# Patient Record
Sex: Female | Born: 1955 | Race: White | Hispanic: No | Smoking: Never smoker
Health system: Southern US, Community
[De-identification: ages and names within clinical notes are randomized; demographics above are authoritative.]

## PROBLEM LIST (undated history)

## (undated) DIAGNOSIS — E079 Disorder of thyroid, unspecified: Secondary | ICD-10-CM

## (undated) DIAGNOSIS — I10 Essential (primary) hypertension: Secondary | ICD-10-CM

---
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
## 2014-07-29 ENCOUNTER — Encounter: Payer: Self-pay | Admitting: Sports Medicine

## 2014-07-29 ENCOUNTER — Ambulatory Visit (INDEPENDENT_AMBULATORY_CARE_PROVIDER_SITE_OTHER): Payer: BC Managed Care – PPO | Admitting: Sports Medicine

## 2014-07-29 ENCOUNTER — Ambulatory Visit (INDEPENDENT_AMBULATORY_CARE_PROVIDER_SITE_OTHER): Payer: BC Managed Care – PPO

## 2014-07-29 VITALS — BP 138/81 | HR 74 | Ht 64.0 in | Wt 187.0 lb

## 2014-07-29 DIAGNOSIS — M722 Plantar fascial fibromatosis: Secondary | ICD-10-CM

## 2014-07-29 DIAGNOSIS — M7731 Calcaneal spur, right foot: Secondary | ICD-10-CM

## 2014-07-29 MED ORDER — MELOXICAM 15 MG PO TABS
ORAL_TABLET | ORAL | Status: AC
Start: 2014-07-29 — End: ?

## 2014-07-29 NOTE — Progress Notes (Signed)
   Subjective:    I'm seeing this patient as a consultation for:  Dr. Angelena SoleWeston Saunders  CC: right heel pain  HPI: For several weeks this pleasant 58 year old female has noted increasing pain at the calcaneal insertion of her right plantar fascia, worse with the first few steps in the morning, moderate, persistent without radiation.  Past medical history, Surgical history, Family history not pertinant except as noted below, Social history, Allergies, and medications have been entered into the medical record, reviewed, and no changes needed.   Review of Systems: No headache, visual changes, nausea, vomiting, diarrhea, constipation, dizziness, abdominal pain, skin rash, fevers, chills, night sweats, weight loss, swollen lymph nodes, body aches, joint swelling, muscle aches, chest pain, shortness of breath, mood changes, visual or auditory hallucinations.   Objective:   General: Well Developed, well nourished, and in no acute distress.  Neuro/Psych: Alert and oriented x3, extra-ocular muscles intact, able to move all 4 extremities, sensation grossly intact. Skin: Warm and dry, no rashes noted.  Respiratory: Not using accessory muscles, speaking in full sentences, trachea midline.  Cardiovascular: Pulses palpable, no extremity edema. Abdomen: Does not appear distended. Right Foot: No visible erythema or swelling. Range of motion is full in all directions. Strength is 5/5 in all directions. No hallux valgus. No pes cavus or pes planus. No abnormal callus noted. No pain over the navicular prominence, or base of fifth metatarsal. tender to palpation of the calcaneal insertion of plantar fascia. No pain at the Achilles insertion. No pain over the calcaneal bursa. No pain of the retrocalcaneal bursa. No tenderness to palpation over the tarsals, metatarsals, or phalanges. No hallux rigidus or limitus. No tenderness palpation over interphalangeal joints. No pain with compression of the  metatarsal heads. Neurovascularly intact distally.  X-rays show plantar calcaneal spur as expected.  Impression and Recommendations:   This case required medical decision making of moderate complexity.

## 2014-07-29 NOTE — Assessment & Plan Note (Signed)
Xrays, meloxicam, return for custom orthotics. Injection if no better.

## 2014-08-31 ENCOUNTER — Ambulatory Visit (INDEPENDENT_AMBULATORY_CARE_PROVIDER_SITE_OTHER): Payer: BC Managed Care – PPO | Admitting: Sports Medicine

## 2014-08-31 ENCOUNTER — Encounter: Payer: Self-pay | Admitting: Sports Medicine

## 2014-08-31 VITALS — BP 128/75 | HR 55 | Ht 64.0 in | Wt 186.0 lb

## 2014-08-31 DIAGNOSIS — M722 Plantar fascial fibromatosis: Secondary | ICD-10-CM

## 2014-08-31 NOTE — Assessment & Plan Note (Signed)
Already essentially pain-free with rehabilitation exercises, custom orthotics as above. Return in 2 months.

## 2014-08-31 NOTE — Progress Notes (Signed)

## 2014-11-01 ENCOUNTER — Ambulatory Visit (INDEPENDENT_AMBULATORY_CARE_PROVIDER_SITE_OTHER): Payer: BC Managed Care – PPO | Admitting: Sports Medicine

## 2014-11-01 ENCOUNTER — Encounter: Payer: Self-pay | Admitting: Sports Medicine

## 2014-11-01 VITALS — BP 144/89 | HR 80 | Wt 184.0 lb

## 2014-11-01 DIAGNOSIS — M722 Plantar fascial fibromatosis: Secondary | ICD-10-CM | POA: Diagnosis not present

## 2014-11-01 NOTE — Progress Notes (Signed)
  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  Past medical history, Surgical history, Family history not pertinant except as noted below, Social history, Allergies, and medications have been entered into the medical record, reviewed, and no changes needed.   Review of Systems: No fevers, chills, night sweats, weight loss, chest pain, or shortness of breath.   Objective:    General: Well Developed, well nourished, and in no acute distress.  Neuro: Alert and oriented x3, extra-ocular muscles intact, sensation grossly intact.  HEENT: Normocephalic, atraumatic, pupils equal round reactive to light, neck supple, no masses, no lymphadenopathy, thyroid nonpalpable.  Skin: Warm and dry, no rashes. Cardiac: Regular rate and rhythm, no murmurs rubs or gallops, no lower extremity edema.  Respiratory: Clear to auscultation bilaterally. Not using accessory muscles, speaking in full sentences. Right Foot: No visible erythema or swelling. Range of motion is full in all directions. Strength is 5/5 in all directions. No hallux valgus. No pes cavus or pes planus. No abnormal callus noted. No pain over the navicular prominence, or base of fifth metatarsal. No tenderness to palpation of the calcaneal insertion of plantar fascia. No pain at the Achilles insertion. No pain over the calcaneal bursa. No pain of the retrocalcaneal bursa. No tenderness to palpation over the tarsals, metatarsals, or phalanges. No hallux rigidus or limitus. No tenderness palpation over interphalangeal joints. No pain with compression of the metatarsal heads. Neurovascularly intact distally.  Impression and Recommendations:

## 2014-11-01 NOTE — Assessment & Plan Note (Signed)
Overall doing very well with plantar fasciitis, orthotics have worked well. I have added some gel cups to wear with her sandals. Continue rehabilitation exercises, may return to see me on an as-needed basis.

## 2016-01-17 IMAGING — CR DG FOOT COMPLETE 3+V*R*
3 series · 3 of 3 positions shown · non-contrast
Comparison: None.

CLINICAL DATA: Initial encounter for right heel pain without known
injury

EXAM:
RIGHT FOOT COMPLETE - 3+ VIEW

[view not recorded (1 of 3)]
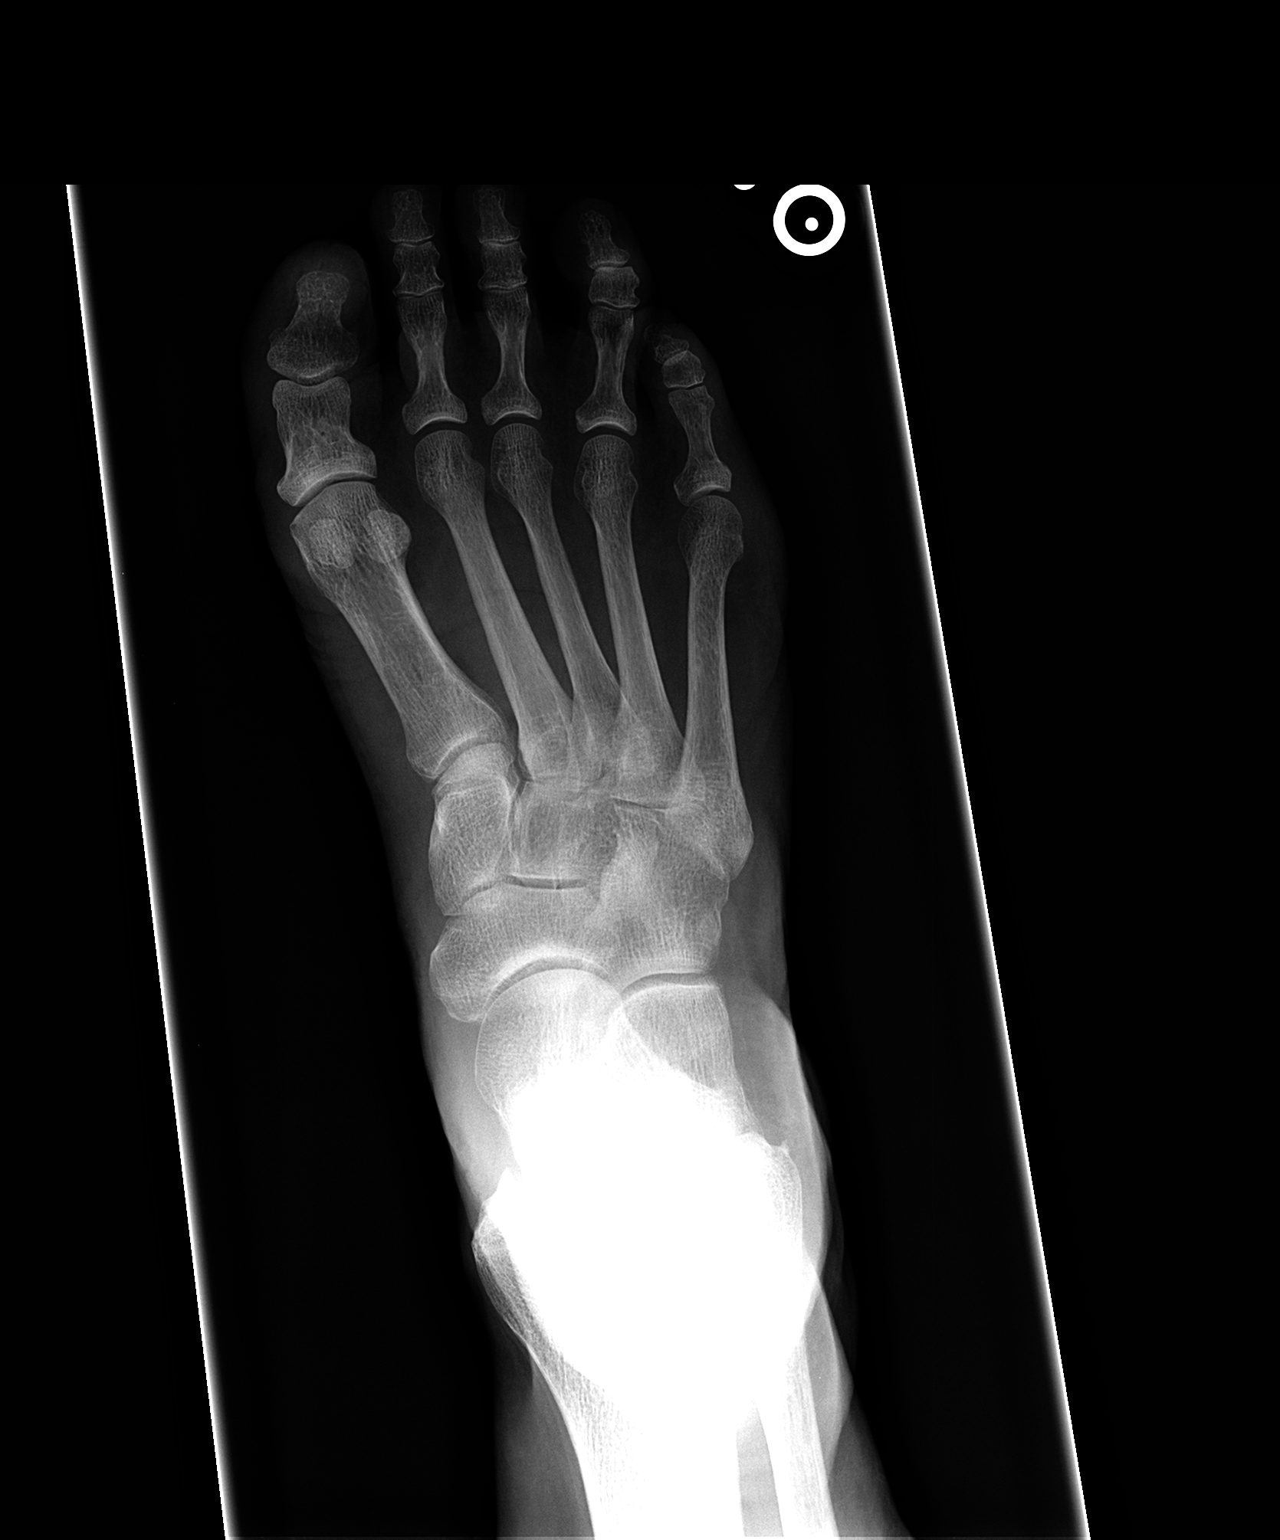

[view not recorded (2 of 3)]
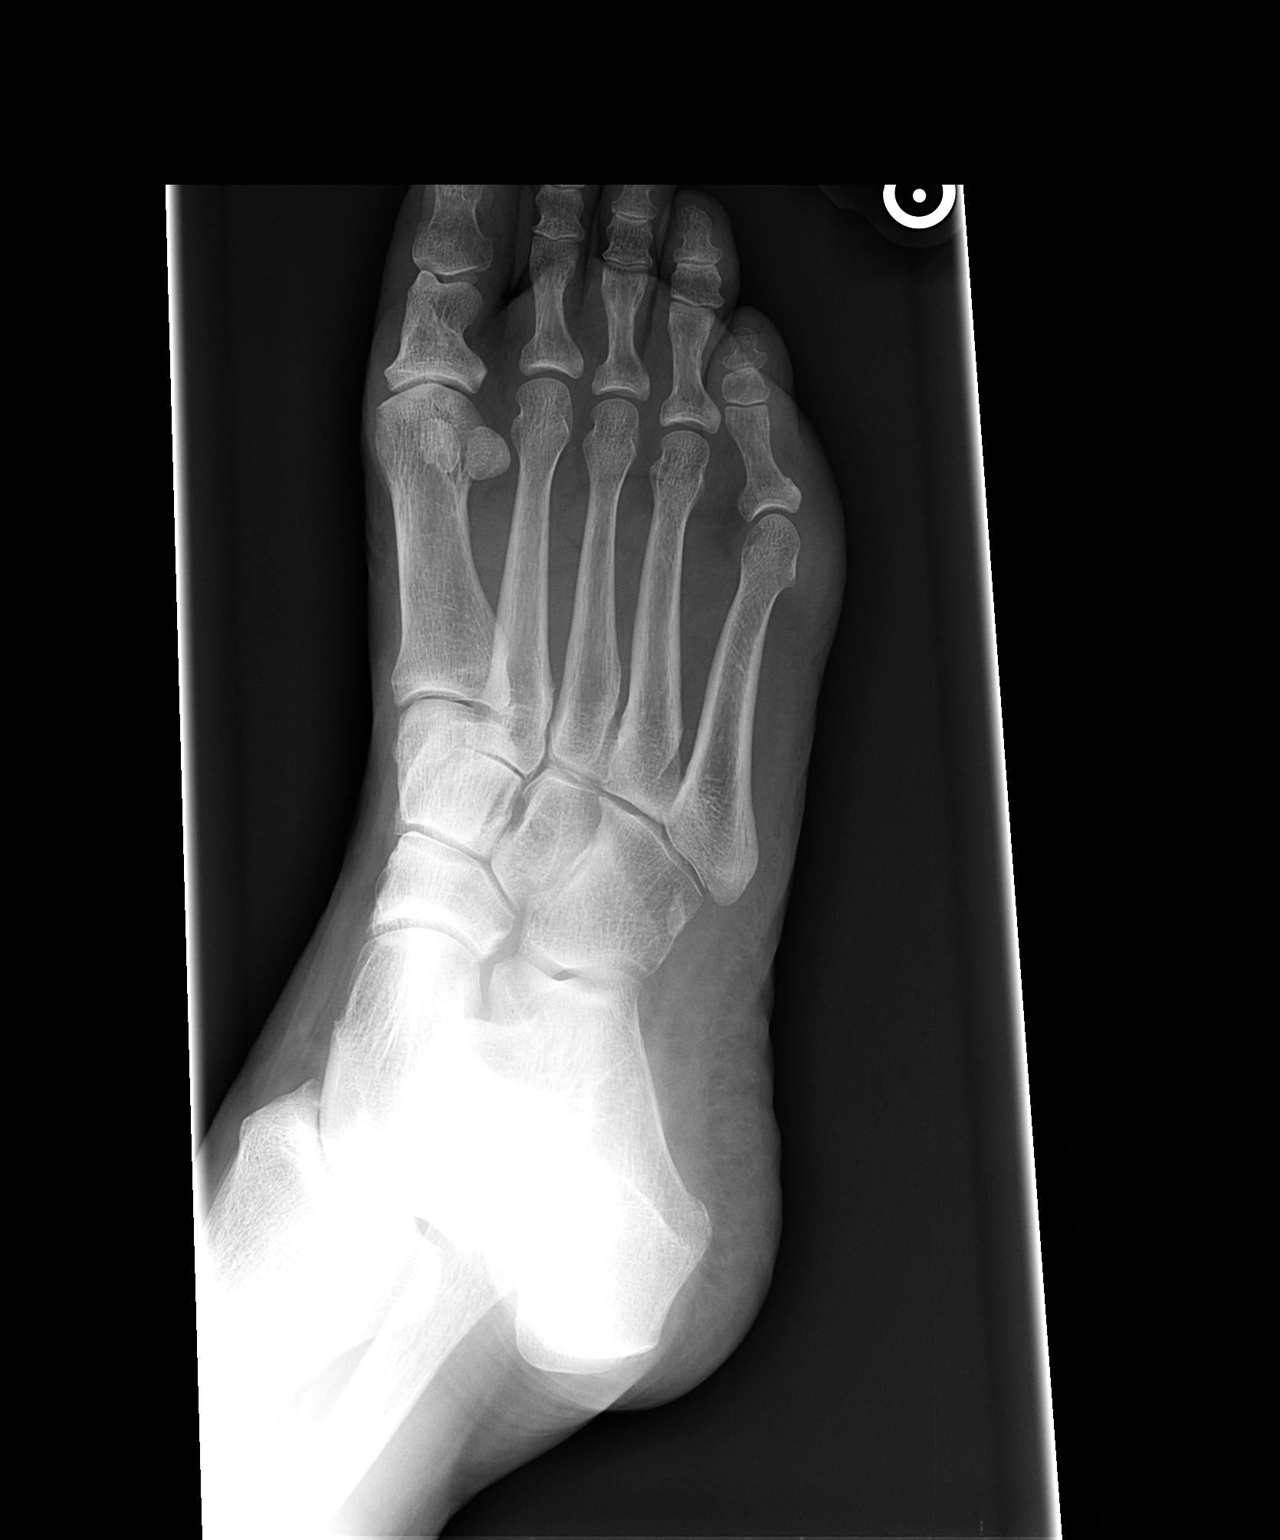

[view not recorded (3 of 3)]
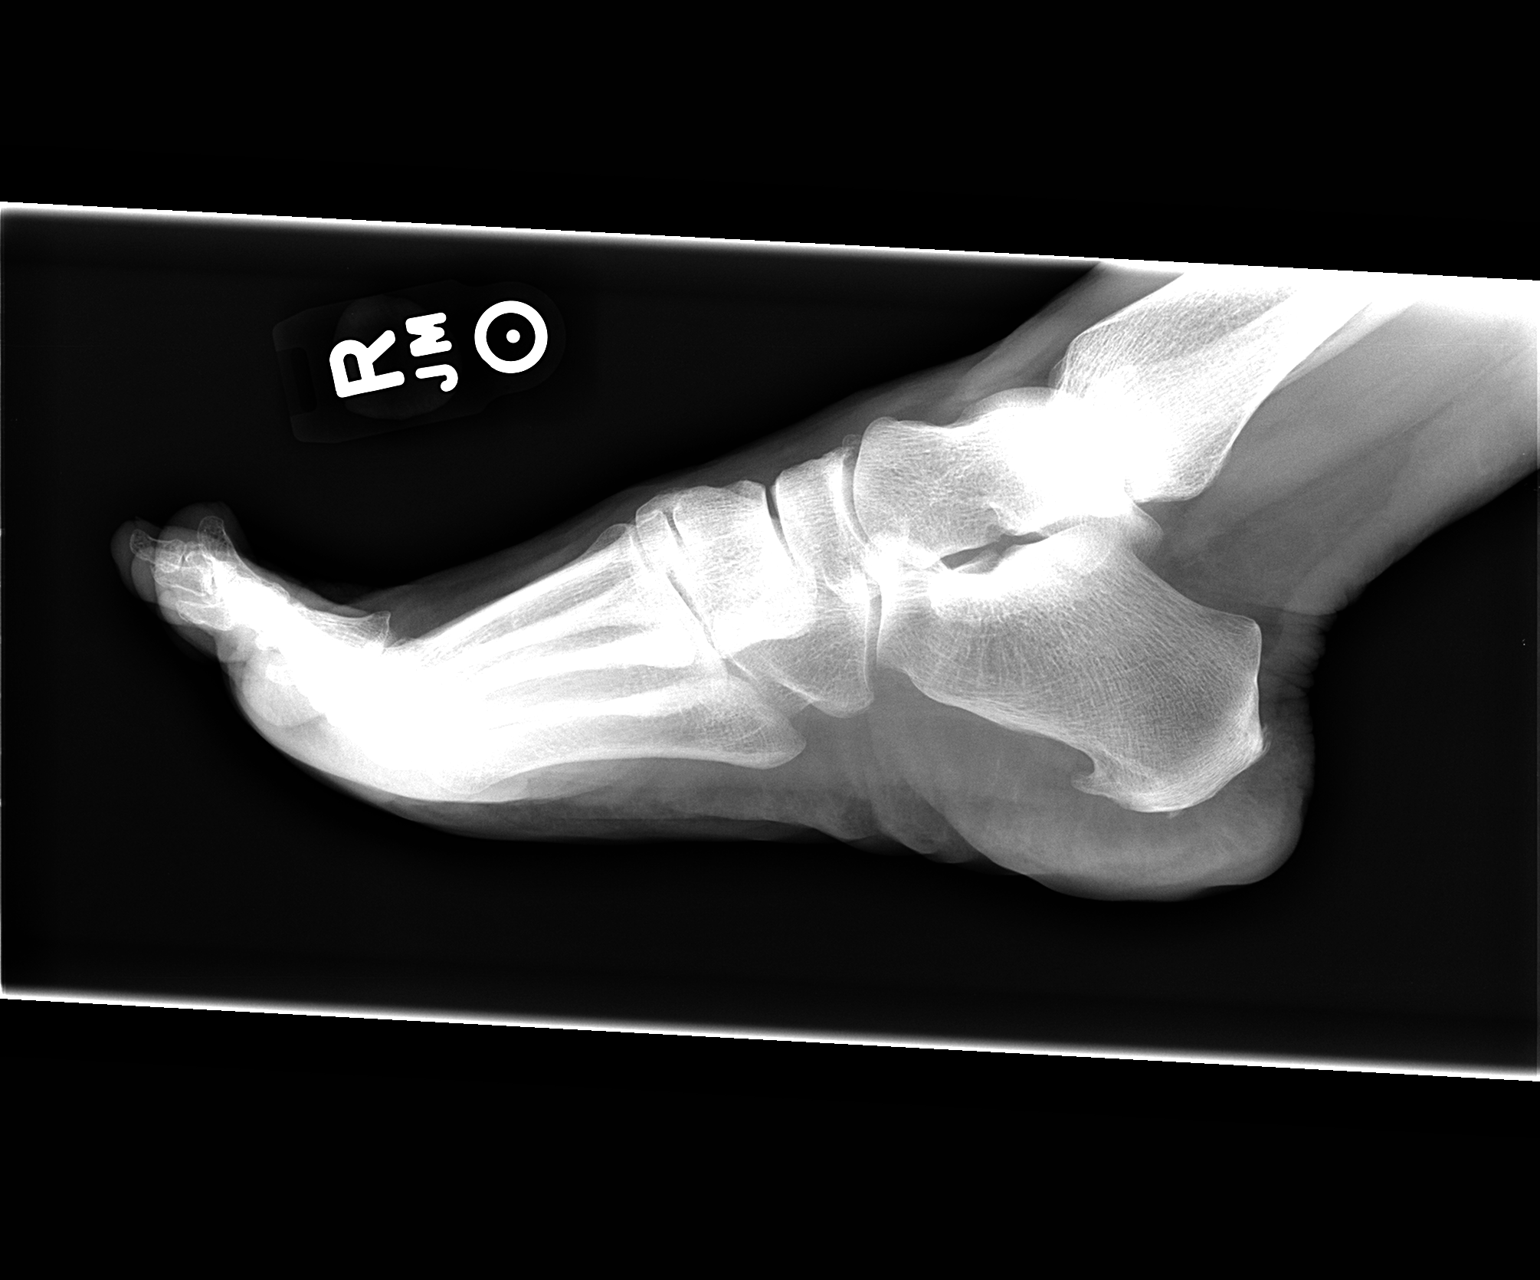

[3 of 3 positions shown; findings below may reference images not displayed]

FINDINGS: No fracture. No subluxation or dislocation. No worrisome lytic or
sclerotic osseous abnormality. Small plantar spur as seen on the
calcaneal tuberosity.
IMPRESSION: No acute findings.

## 2016-10-23 ENCOUNTER — Emergency Department (INDEPENDENT_AMBULATORY_CARE_PROVIDER_SITE_OTHER): Payer: BC Managed Care – PPO

## 2016-10-23 ENCOUNTER — Emergency Department
Admission: EM | Admit: 2016-10-23 | Discharge: 2016-10-23 | Disposition: A | Discharge status: Home / Self Care | Payer: BC Managed Care – PPO | Source: Home / Self Care

## 2016-10-23 ENCOUNTER — Encounter: Payer: Self-pay | Admitting: *Deleted

## 2016-10-23 DIAGNOSIS — M7671 Peroneal tendinitis, right leg: Secondary | ICD-10-CM

## 2016-10-23 DIAGNOSIS — M25571 Pain in right ankle and joints of right foot: Secondary | ICD-10-CM | POA: Diagnosis not present

## 2016-10-23 HISTORY — DX: Essential (primary) hypertension: I10

## 2016-10-23 HISTORY — DX: Disorder of thyroid, unspecified: E07.9

## 2016-10-23 MED ORDER — PREDNISONE 20 MG PO TABS: 20.0000 mg | ORAL_TABLET | Freq: Two times a day (BID) | ORAL | 0 refills | Status: DC

## 2016-10-23 NOTE — ED Triage Notes (Signed)
Pt c/o RT ankle swelling with minimal pain x 1 day. Denies injury.

## 2016-10-23 NOTE — Discharge Instructions (Signed)
Apply ice pack for 30 minutes every 1 to 2 hours today and tomorrow.  Elevate.  May wear an Ace wrap until swelling decreases.  Wear brace for about 2 to 3 weeks.  Begin range of motion and stretching exercises in about 5 days as per instruction sheet.  May take Tylenol as needed for fever, headache, sore throat, etc.

## 2016-10-23 NOTE — ED Provider Notes (Signed)
[facility]    CSN: [id number] Arrival date & time: 10/23/16  1821     History   Chief Complaint Chief Complaint  Patient presents with  . Ankle Pain    HPI Darlene Wiley is a 61 y.o. female.   While walking yesterday, patient developed increasing pain in her right ankle.  She recalls no injury.   The history is provided by the patient.  Ankle Pain  Location:  Ankle Time since incident:  1 day Injury: no   Ankle location:  R ankle Pain details:    Quality:  Aching   Radiates to:  Does not radiate   Severity:  Moderate   Onset quality:  Gradual   Duration:  1 day   Timing:  Constant   Progression:  Worsening Chronicity:  New Prior injury to area:  No Relieved by:  None tried Worsened by:  Bearing weight (walking) Ineffective treatments:  None tried Associated symptoms: decreased ROM and stiffness   Associated symptoms: no swelling     Past Medical History:  Diagnosis Date  . Hypertension   . Thyroid disease     Patient Active Problem List   Diagnosis Date Noted  . Plantar fasciitis, right 07/29/2014    Past Surgical History:  Procedure Laterality Date  . CESAREAN SECTION      OB History    No data available       Home Medications    Prior to Admission medications   Medication Sig Start Date End Date Taking? Authorizing Provider  ASHWAGANDHA PO Take by mouth.   Yes Historical Provider, MD  estradiol (ESTRACE) 1 MG tablet Take 1 mg by mouth daily.   Yes Historical Provider, MD  Levothyroxine Sodium (SYNTHROID PO) Take by mouth.   Yes Historical Provider, MD  Nutritional Supplements (PYCNOGENOL PO) Take by mouth.   Yes Historical Provider, MD  Probiotic Product (PROBIOTIC DAILY PO) Take by mouth.   Yes Historical Provider, MD  progesterone (PROMETRIUM) 200 MG capsule Take 200 mg by mouth daily.   Yes Historical Provider, MD  thyroid (NATURE-THROID) 65 MG tablet Take 65 mg by mouth daily.   Yes Historical Provider, MD  magnesium 30  MG tablet Take 30 mg by mouth 2 (two) times daily.    Historical Provider, MD  meloxicam (MOBIC) 15 MG tablet One tab PO qAM with breakfast for 2 weeks, then daily prn pain. 07/29/14   Monica Becton, MD  predniSONE (DELTASONE) 20 MG tablet Take 1 tablet (20 mg total) by mouth 2 (two) times daily. Take with food. 10/23/16   Lattie Haw, MD  Thiamine HCl (VITAMIN B-1 PO) Take by mouth.    Historical Provider, MD  Vitamin K, Phytonadione, 100 MCG TABS Take by mouth.    Historical Provider, MD    Family History Family History  Problem Relation Age of Onset  . Heart attack Mother   . Dementia Father   . Parkinson's disease Father     Social History Social History  Substance Use Topics  . Smoking status: Never Smoker  . Smokeless tobacco: Never Used  . Alcohol use No     Allergies   Patient has no known allergies.   Review of Systems Review of Systems  Musculoskeletal: Positive for stiffness.  All other systems reviewed and are negative.    Physical Exam Triage Vital Signs ED Triage Vitals  Enc Vitals Group     BP 10/23/16 1842 (!) 171/111     Pulse Rate  10/23/16 1842 93     Resp 10/23/16 1842 18     Temp 10/23/16 1842 97.8 F (36.6 C)     Temp Source 10/23/16 1842 Oral     SpO2 10/23/16 1842 99 %     Weight 10/23/16 1845 190 lb (86.2 kg)     Height 10/23/16 1845 5\' 4"  (1.626 m)     Head Circumference --      Peak Flow --      Pain Score 10/23/16 1846 0     Pain Loc --      Pain Edu? --      Excl. in GC? --    No data found.   Updated Vital Signs BP (!) 171/111 (BP Location: Left Arm)   Pulse 93   Temp 97.8 F (36.6 C) (Oral)   Resp 18   Ht 5\' 4"  (1.626 m)   Wt 190 lb (86.2 kg)   SpO2 99%   BMI 32.61 kg/m   Visual Acuity Right Eye Distance:   Left Eye Distance:   Bilateral Distance:    Right Eye Near:   Left Eye Near:    Bilateral Near:     Physical Exam  Constitutional: She appears well-developed and well-nourished. No distress.    HENT:  Head: Normocephalic.  Eyes: Pupils are equal, round, and reactive to light.  Cardiovascular: Normal rate.   Pulmonary/Chest: Effort normal.  Musculoskeletal:       Right ankle: She exhibits decreased range of motion and swelling. She exhibits no ecchymosis. Tenderness. Lateral malleolus tenderness found. No head of 5th metatarsal tenderness found. Achilles tendon normal.       Feet:  There is tenderness over the course of the right peroneal tendon.  Pain is elicited with resisted eversion and resisted plantar flexion of the ankle.  Distal neurovascular function is intact.    Neurological: She is alert.  Skin: Skin is warm and dry.  Nursing note and vitals reviewed.    UC Treatments / Results  Labs (all labs ordered are listed, but only abnormal results are displayed) Labs Reviewed - No data to display  EKG  EKG Interpretation None       Radiology Dg Ankle Complete Right  Result Date: 10/23/2016 CLINICAL DATA:  Right ankle pain starting yesterday, no known injury EXAM: RIGHT ANKLE - COMPLETE 3+ VIEW COMPARISON:  None. FINDINGS: Three views of the right ankle submitted. No acute fracture or subluxation. Ankle mortise is preserved. There is soft tissue swelling adjacent to lateral malleolus. Plantar spur of calcaneus is noted. IMPRESSION: No acute fracture or subluxation. Soft tissue swelling adjacent to lateral malleolus. Electronically Signed   By: Natasha MeadLiviu  Pop M.D.   On: 10/23/2016 19:00    Procedures Procedures (including critical care time)  Medications Ordered in UC Medications - No data to display   Initial Impression / Assessment and Plan / UC Course  I have reviewed the triage vital signs and the nursing notes.  Pertinent labs & imaging results that were available during my care of the patient were reviewed by me and considered in my medical decision making (see chart for details).    AirCast stirrup splint applied. Begin prednisone burst.  Apply ice  pack for 30 minutes every 1 to 2 hours today and tomorrow.  Elevate.  May wear an Ace wrap until swelling decreases.  Wear brace for about 2 to 3 weeks.  Begin range of motion and stretching exercises in about 5 days as per instruction sheet.  May take Tylenol as needed for fever, headache, sore throat, etc.  Followup with Dr. Rodney Langton or Dr. Clementeen [facility] ([facility]) if not improving about two weeks.    Final Clinical Impressions(s) / UC Diagnoses   Final diagnoses:  Peroneal tendonitis of right lower extremity    New Prescriptions New Prescriptions   PREDNISONE (DELTASONE) 20 MG TABLET    Take 1 tablet (20 mg total) by mouth 2 (two) times daily. Take with food.     Lattie Haw, MD 11/05/16 [phone]

## 2018-04-13 IMAGING — DX DG ANKLE COMPLETE 3+V*R*
3 series · 3 of 3 positions shown · non-contrast
Comparison: None.

CLINICAL DATA: Right ankle pain starting yesterday, no known injury

EXAM:
RIGHT ANKLE - COMPLETE 3+ VIEW

[ankle ap]
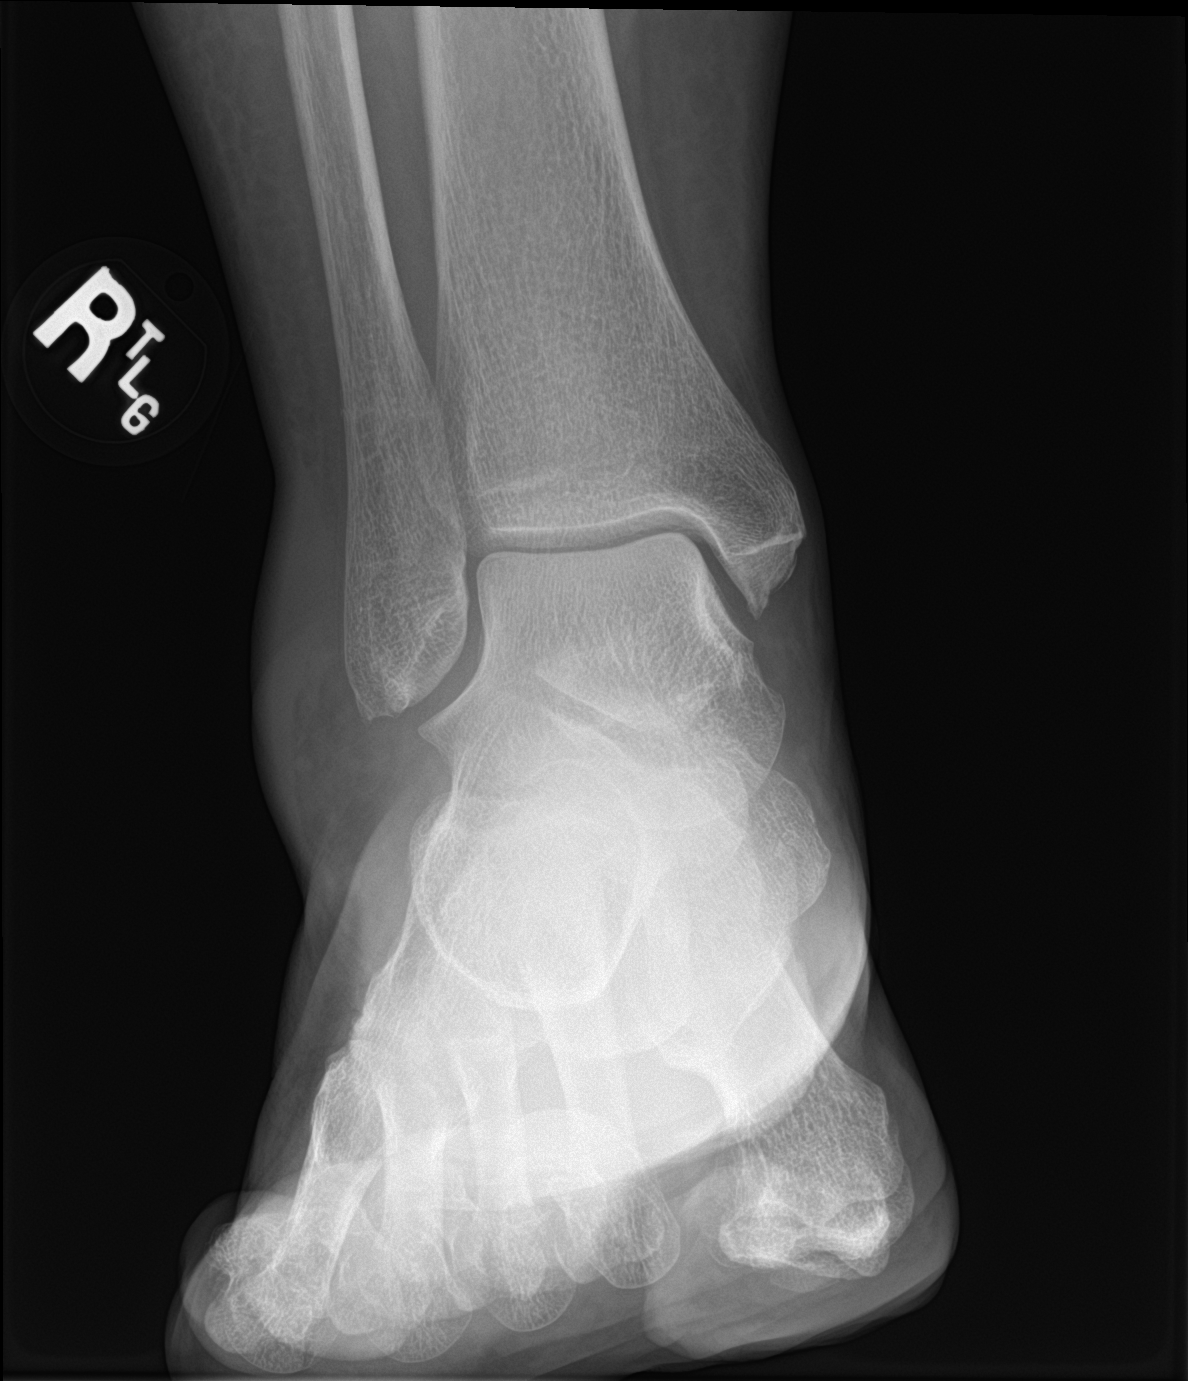

[ankle obl]
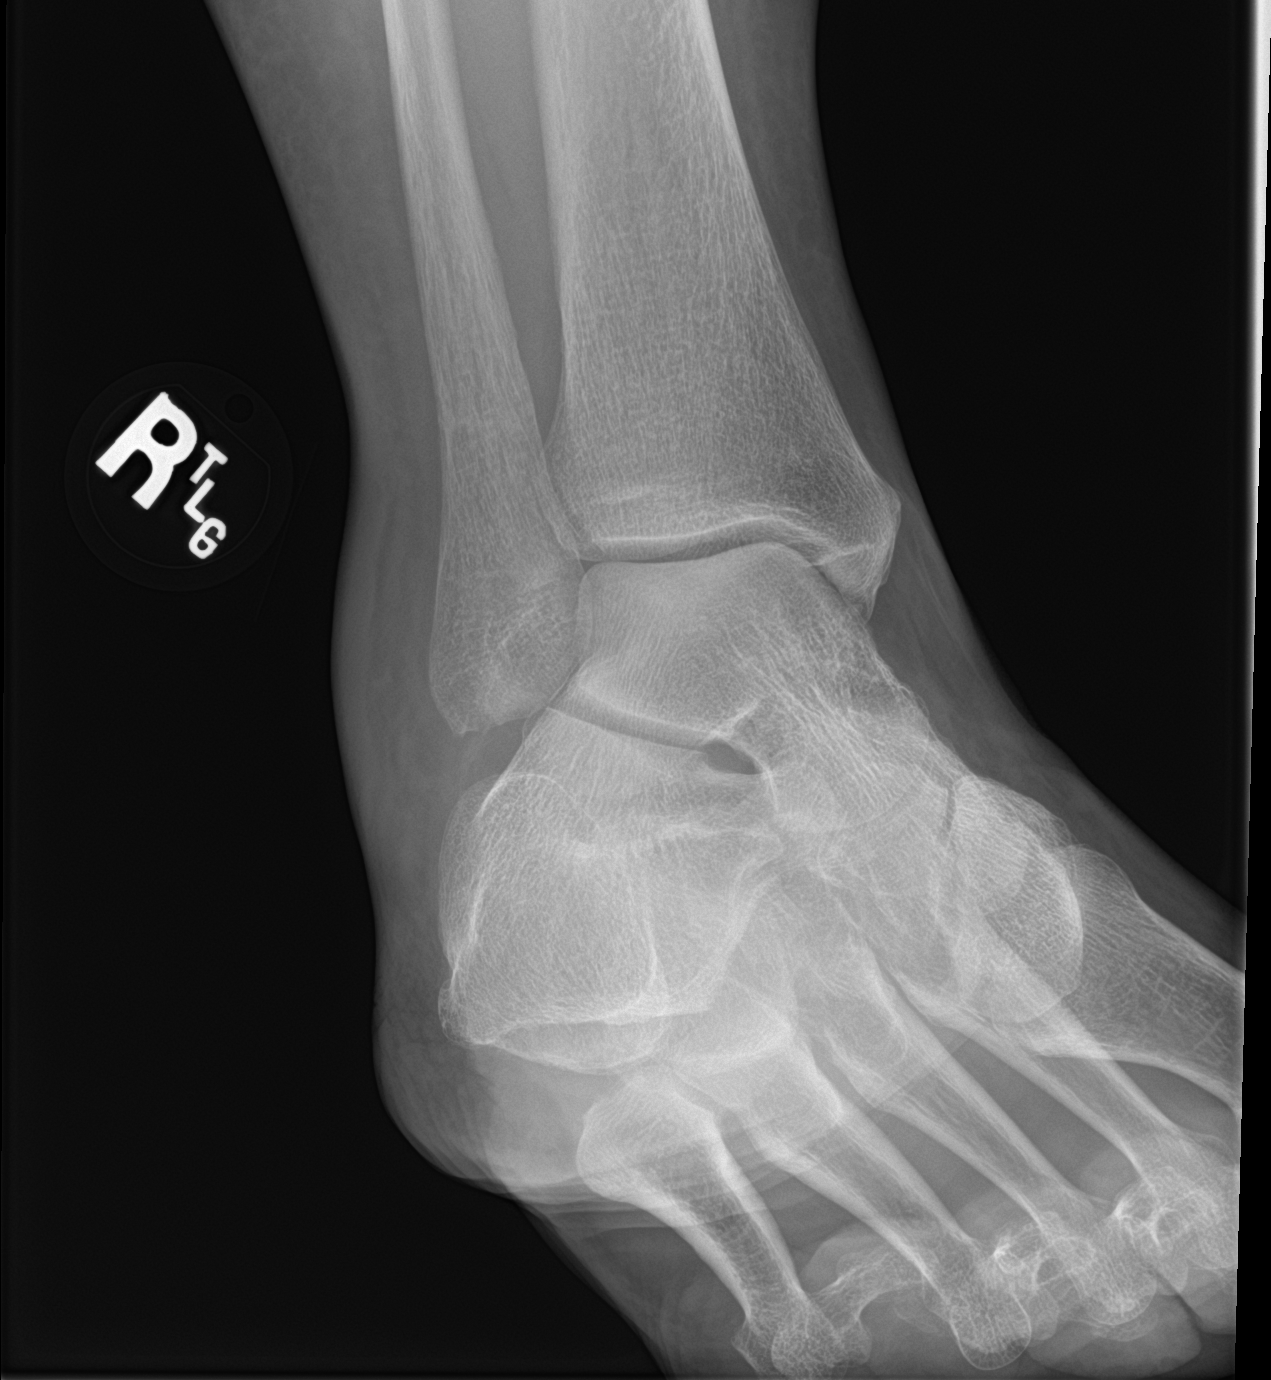

[ankle lat]
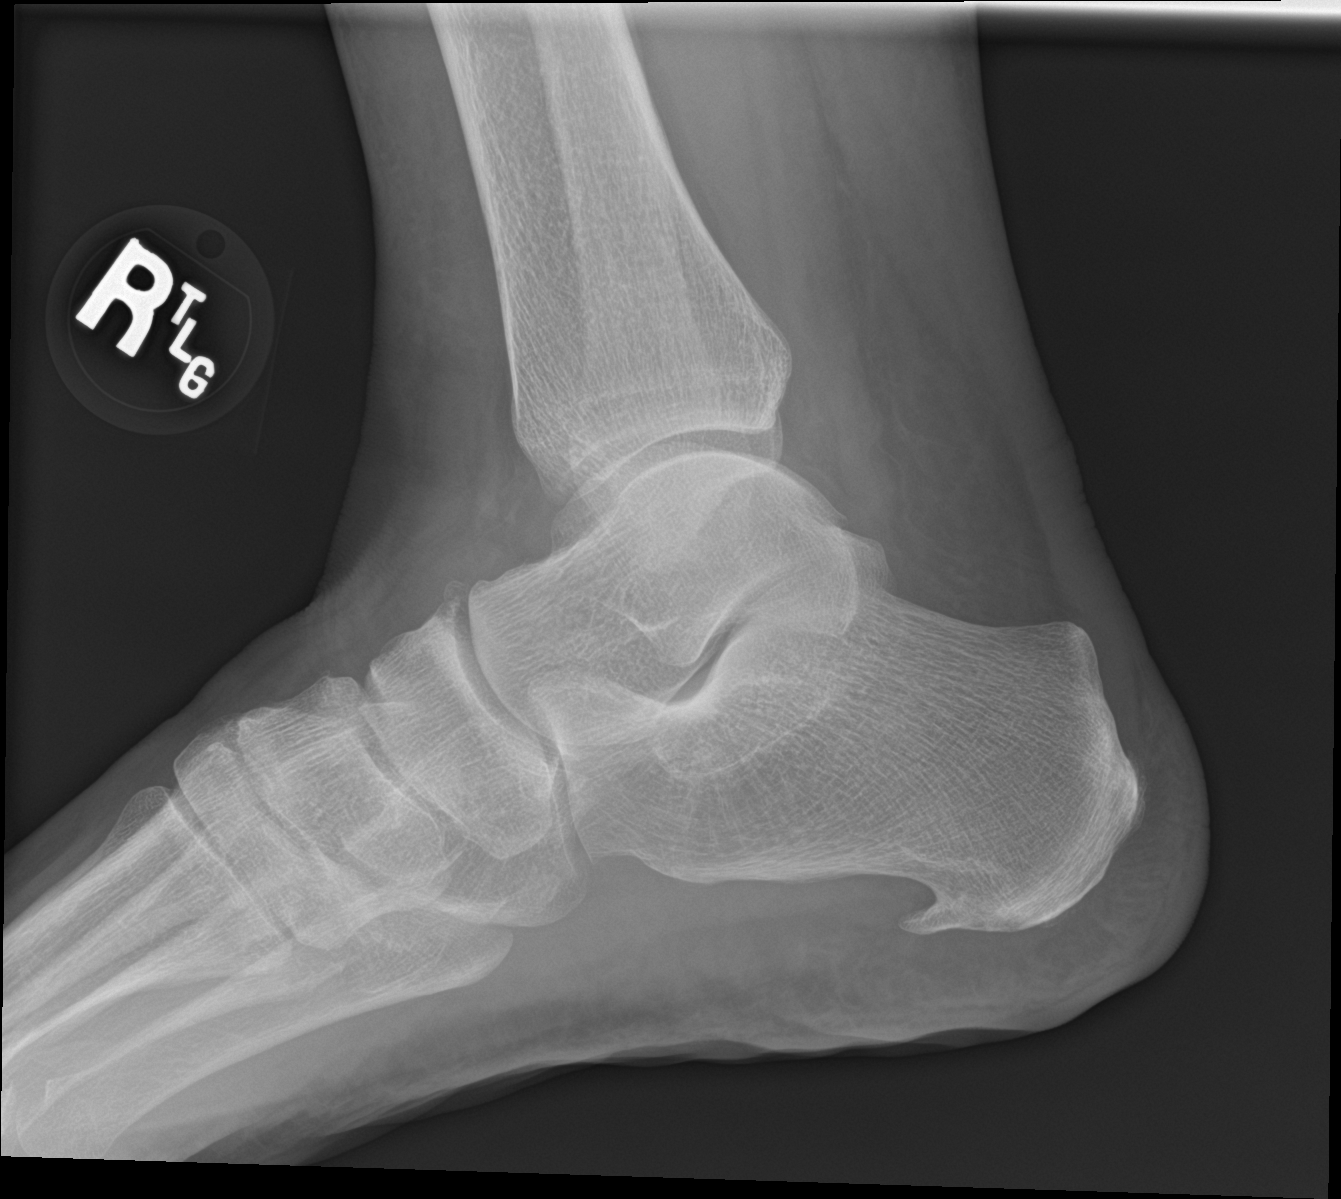

[3 of 3 positions shown; findings below may reference images not displayed]

FINDINGS: Three views of the right ankle submitted. No acute fracture or
subluxation. Ankle mortise is preserved. There is soft tissue
swelling adjacent to lateral malleolus. Plantar spur of calcaneus is
noted.
IMPRESSION: No acute fracture or subluxation. Soft tissue swelling adjacent to
lateral malleolus.

## 2023-08-04 LAB — COLOGUARD: COLOGUARD: NEGATIVE

## 2024-04-15 ENCOUNTER — Encounter: Payer: Self-pay | Admitting: Internal Medicine

## 2024-04-15 ENCOUNTER — Ambulatory Visit (INDEPENDENT_AMBULATORY_CARE_PROVIDER_SITE_OTHER): Payer: Self-pay | Admitting: Internal Medicine

## 2024-04-15 VITALS — BP 130/70 | HR 70 | Ht 64.75 in | Wt 170.1 lb

## 2024-04-15 DIAGNOSIS — R040 Epistaxis: Secondary | ICD-10-CM | POA: Diagnosis not present

## 2024-04-15 DIAGNOSIS — R490 Dysphonia: Secondary | ICD-10-CM | POA: Diagnosis not present

## 2024-04-15 DIAGNOSIS — J3089 Other allergic rhinitis: Secondary | ICD-10-CM | POA: Diagnosis not present

## 2024-04-15 NOTE — Patient Instructions (Signed)
 Chronic upper airway symptoms with suspected environmental allergies (including mold) Chronic upper airway symptoms suggest mold allergy, exacerbated in spring and post-rain. ENT ruled out sinus issues. Differential includes mold allergy and laryngeal pharyngeal reflux. Allergy testing required for confirmation. Discussed risks of unregulated supplements like Sinupret. - Continue sinus rinses  - Schedule allergy testing. Avoid antihistamines for three days prior (1-55)  - If positive for mold or other allergens, consider allergy shots (immunotherapy). Explained shots are natural, targeting allergic reaction with purified allergens, requiring 3-5 years commitment with initial weekly injections for 9 months. - If negative, consider a four-week trial of Pepcid for laryngeal pharyngeal reflux. - Provided information sheet on allergy injections and insurance coverage.  Dairy intolerance Long-standing dairy intolerance causing constipation and gastrointestinal discomfort. Symptoms worsen with age. Advised limiting dairy to reduce mucus and congestion. - Advise limiting dairy intake to manage symptoms.   Follow up for allergy testing (1-55) avoid all antihistamines for 3 days prior   Thank you so much for letting me partake in your care today.  Don't hesitate to reach out if you have any additional concerns!  Hargis Springer, MD  [facility] [place], [place]

## 2024-04-15 NOTE — Progress Notes (Signed)
 NEW PATIENT Date of Service/Encounter:  04/15/24 Referring provider: Douglass Gerard DEL PA-C Primary care provider: Zackary Megan ORN, MD  Subjective:  Darlene Wiley is a 68 y.o. female  presenting today for evaluation of environmental allergies  History obtained from: chart review and patient.   Discussed the use of AI scribe software for clinical note transcription with the patient, who gave verbal consent to proceed.  History of Present Illness Darlene Wiley is a 68 year old female who presents with chronic upper airway symptoms and voice loss, suspected to be related to environmental allergies.  Chronic upper airway symptoms - Persistent nasal congestion, hoarseness, and postnasal drip for the past 7-8 years - Increased severity of symptoms since February - Congestion frequently accompanied by epistaxis, particularly in the mornings - Presence of phlegm without chest involvement - Symptoms previously worsened in the spring but now persist beyond typical seasonal patterns - Marked temperature changes appear to affect symptoms, though correlation is unclear  Hoarseness and voice loss - Hoarseness present and worsens throughout the day - Hoarseness exacerbated by emotional stress - Voice loss suspected to be related to environmental allergies  Treatment history and response - Previously used Mucinex, discontinued over a year ago due to liver health concerns - Sinupret provided relief when taken three times daily but discontinued due to cost and supplement regulation concerns - Currently not taking any medications for symptoms - Occasional use of saline rinses - History of prednisone  prescription, but effect on symptoms is unknown  Xerostomia (dry mouth) - History of dry mouth - Biotene recommended for management - Not well-hydrated  Dairy sensitivity - Known sensitivity to dairy products, causing gastrointestinal symptoms if consumed in excess - Increased sensitivity to  dairy with age    Chart Review:  Mold testing 04/03/2024.  Of her basement.  Showed colonies of Alternaria, Cladosporium, nonspore relating fungi, ulocladium  mold testing of [employer] classroom showed colonies of Curvularia and nonspore relating fungi  ENT 02/25/24: 1) nasal septal deviation 2) nasal congestion 3) allergic rhinitis  I have asked her to try an intranasal steroid spray and antihistamine together May use saline spray as needed Mupirocin ointment as needed nasal crusting Allergy referral Will be happy to recheck here as needed   Other allergy screening: Asthma: no Rhino conjunctivitis: yes Food allergy: yes; dairy intolerance  Medication allergy: no Hymenoptera allergy: no Urticaria: no Eczema:no History of recurrent infections suggestive of immunodeficency: no Vaccinations are up to date.   Past Medical History: Past Medical History:  Diagnosis Date   Hypertension    Thyroid disease    Medication List:  Current Outpatient Medications  Medication Sig Dispense Refill   ASHWAGANDHA PO Take by mouth.     estradiol (ESTRACE) 1 MG tablet Take 1 mg by mouth daily.     levothyroxine (SYNTHROID) 100 MCG tablet Take 100 mcg by mouth daily.     Levothyroxine Sodium (SYNTHROID PO) Take by mouth.     losartan (COZAAR) 50 MG tablet Take 50 mg by mouth daily.     magnesium 30 MG tablet Take 30 mg by mouth 2 (two) times daily.     meloxicam  (MOBIC ) 15 MG tablet One tab PO qAM with breakfast for 2 weeks, then daily prn pain. 30 tablet 3   mupirocin ointment (BACTROBAN) 2 % Apply in nose 3 times daily as needed irritation, crusting     Nutritional Supplements (PYCNOGENOL PO) Take by mouth.     Omega-3 Fatty Acids (FISH OIL)  1000 MG CAPS Take 1 g by mouth.     Probiotic Product (PROBIOTIC DAILY PO) Take by mouth.     progesterone (PROMETRIUM) 200 MG capsule Take 200 mg by mouth daily.     Thiamine HCl (VITAMIN B-1 PO) Take by mouth.     Vitamin K,  Phytonadione, 100 MCG TABS Take by mouth.     No current facility-administered medications for this visit.   Known Allergies:  No Known Allergies Past Surgical History: Past Surgical History:  Procedure Laterality Date   CESAREAN SECTION     TONSILLECTOMY  1961   Family History: Family History  Problem Relation Age of Onset   Heart attack Mother    Dementia Father    Parkinson's disease Father    Allergic rhinitis Neg Hx    Angioedema Neg Hx    Asthma Neg Hx    Atopy Neg Hx    Eczema Neg Hx    Immunodeficiency Neg Hx    Urticaria Neg Hx    Social History: [place] lives single-family home is 69 years old.  Water damage in the house.  Hardwood throughout.  Heat pump for heating.  2 dogs and 1 cat, 2 dogs have access to bedroom.  No roaches in the house but has to get off floor.  No tobacco exposure.  Works as a Public relations account executive..   ROS:  All other systems negative except as noted per HPI.  Objective:  Blood pressure 130/70, pulse 70, height 5' 4.75 (1.645 m), weight 170 lb 1.6 oz (77.2 kg), SpO2 99%. Body mass index is 28.53 kg/m. Physical Exam:  General Appearance:  Alert, cooperative, no distress, appears stated age, hoarse voice   Head:  Normocephalic, without obvious abnormality, atraumatic  Eyes:  Conjunctiva clear, EOM's intact  Ears Left TM-  and EACs normal bilaterally  Nose: Nares normal, erythematous nasal mucosa , no visible anterior polyps, and septum midline  Throat: Lips, tongue normal; teeth and gums normal, mildly erythematous posterior oropharynx  Neck: Supple, symmetrical  Lungs:   clear to auscultation bilaterally, Respirations unlabored, no coughing  Heart:  regular rate and rhythm and no murmur, Appears well perfused  Extremities: No edema  Skin: Skin color, texture, turgor normal and no rashes or lesions on visualized portions of skin  Neurologic: No gross deficits   Diagnostics: None done    Labs:  Lab Orders  No laboratory test(s)  ordered today     Assessment and Plan  Assessment and Plan Assessment & Plan Chronic upper airway symptoms with suspected environmental allergies (including mold) Chronic upper airway symptoms suggest mold allergy, exacerbated in spring and post-rain. ENT ruled out sinus issues. Differential includes mold allergy and laryngeal pharyngeal reflux. Allergy testing required for confirmation. Discussed risks of unregulated supplements like Sinupret. - Continue sinus rinses  - Schedule allergy testing. Avoid antihistamines for three days prior (1-55)  - If positive for mold or other allergens, consider allergy shots (immunotherapy). Explained shots are natural, targeting allergic reaction with purified allergens, requiring 3-5 years commitment with initial weekly injections for 9 months. - If negative, consider a four-week trial of Pepcid for laryngeal pharyngeal reflux. - Provided information sheet on allergy injections and insurance coverage.  Dairy intolerance Long-standing dairy intolerance causing constipation and gastrointestinal discomfort. Symptoms worsen with age. Advised limiting dairy to reduce mucus and congestion. - Advise limiting dairy intake to manage symptoms.   Follow up for allergy testing (1-55) avoid all antihistamines for 3 days prior  This note in its entirety was forwarded to the Provider who requested this consultation.  Other:    Thank you for your kind referral. I appreciate the opportunity to take part in Nicollette's care. Please do not hesitate to contact me with questions.  Sincerely,  Thank you so much for letting me partake in your care today.  Don't hesitate to reach out if you have any additional concerns!  Hargis Springer, MD  [facility] [place], [place]

## 2024-04-22 ENCOUNTER — Ambulatory Visit: Admitting: Internal Medicine

## 2024-04-29 ENCOUNTER — Ambulatory Visit: Admitting: Internal Medicine

## 2024-04-29 DIAGNOSIS — J3089 Other allergic rhinitis: Secondary | ICD-10-CM

## 2024-04-29 NOTE — Progress Notes (Unsigned)
 Date of Service/Encounter:  04/30/24  Allergy testing appointment   Initial visit on 04/15/24, seen for rhinitis .  Please see that note for additional details.  Today reports for allergy diagnostic testing:    DIAGNOSTICS:  Skin Testing: Environmental allergy panel. Adequate positive and negative controls Results discussed with patient/family.  Airborne Adult Perc - 04/29/24 1454     Time Antigen Placed 1454    Allergen Manufacturer Jestine    Location Back    Number of Test 55    1. Control-Buffer 50% Glycerol Negative    2. Control-Histamine 4+    3. [place] Negative    4. [place] Negative    5. Johnson Negative    6. [place]  Blue Negative    7. Meadow Fescue 2+    8. Perennial Rye 2+    9. Timothy 2+    10. Ragweed Mix Negative    11. Cocklebur Negative    12. Plantain,  English 2+    13. Baccharis 2+    14. Dog Fennel Negative    15. [place] Thistle 2+    16. Lamb's Quarters Negative    17. Sheep Sorrell 2+    18. Rough Pigweed 2+    19. Marsh Elder, Rough Negative    20. Mugwort, Common 2+    21. Box, Elder 2+    22. Cedar, red 2+    23. Sweet Gum 2+    24. Pecan Pollen 2+    25. Pine Mix 2+    26. Walnut, Black Pollen 2+    27. Red Mulberry Negative    28. Ash Mix Negative    29. Birch Mix Negative    30. Beech American 2+    31. Cottonwood, [place] 2+    32. Hickory, White 2+    33. Maple Mix 2+    34. Oak, [place] Mix 2+    35. Sycamore Eastern Negative    36. Alternaria Alternata Negative    37. Cladosporium Herbarum Negative    38. Aspergillus Mix Negative    39. Penicillium Mix 2+    40. Bipolaris Sorokiniana (Helminthosporium) 2+    41. Drechslera Spicifera (Curvularia) 2+    42. Mucor Plumbeus Negative    43. Fusarium Moniliforme 2+    44. Aureobasidium Pullulans (pullulara) 2+    45. Rhizopus Oryzae Negative    46. Botrytis Cinera Negative    47. Epicoccum Nigrum Negative    48. Phoma Betae Negative    49. Dust Mite Mix Negative    50.  Cat Hair 10,000 BAU/ml Negative    51.  Dog Epithelia 2+    52. Mixed Feathers Negative    53. Horse Epithelia Negative    54. Cockroach, German Negative    55. Tobacco Leaf Negative          Intradermal - 04/29/24 1551     Time Antigen Placed 1551    Allergen Manufacturer Jestine    Location Arm    Number of Test 13    Control Negative    Bahia Negative    [place] Negative    Johnson Negative    Ragweed Mix Negative    Mold 1 Negative    Mite Mix Negative    Cat Negative    Cockroach Negative          Allergy testing results were read and interpreted by myself, documented by clinical staff.    Patient provided with copy of allergy testing along with avoidance measures  when indicated.   Discussed treatment options and patient would like to start immunotherapy via standard buildup at the [facility] office  Start allergy injections. Had a detailed discussion with patient/family that clinical history is suggestive of allergic rhinitis, and may benefit from allergy immunotherapy (AIT). Discussed in detail regarding the dosing, schedule, side effects (mild to moderate local allergic reaction and rarely systemic allergic reactions including anaphylaxis/death), alternatives and benefits (significant improvement in nasal symptoms, seasonal flares of asthma) of immunotherapy with the patient. There is significant time commitment involved with allergy shots, which includes weekly immunotherapy injections for first 9-12 months and then biweekly to monthly injections for 3-5 years. Clinical response is often delayed and patient may not see an improvement for 6-12 months. Consent was signed. I have prescribed epinephrine injectable and demonstrated proper use. For mild symptoms you can take over the counter antihistamines such as Benadryl and monitor symptoms closely. If symptoms worsen or if you have severe symptoms including breathing issues, throat closure, significant swelling, whole body  hives, severe diarrhea and vomiting, lightheadedness then inject epinephrine and seek immediate medical care afterwards. Action plan given.   Hargis Springer, MD  [facility] 

## 2024-04-30 DIAGNOSIS — J302 Other seasonal allergic rhinitis: Secondary | ICD-10-CM | POA: Diagnosis not present

## 2024-04-30 DIAGNOSIS — J301 Allergic rhinitis due to pollen: Secondary | ICD-10-CM | POA: Diagnosis not present

## 2024-04-30 DIAGNOSIS — J3081 Allergic rhinitis due to animal (cat) (dog) hair and dander: Secondary | ICD-10-CM | POA: Diagnosis not present

## 2024-04-30 NOTE — Progress Notes (Signed)
 Aeroallergen Immunotherapy  Ordering Provider: Dr. Hargis Springer  Patient Details Name: Darlene Wiley MRN: [id number] Date of Birth: [birth year]  Order 2 of 2  Vial Label: G-W-T-D  0.3 ml (Volume)  BAU Concentration -- 7 Grass Mix* 100,000 ([place]  Blue, [place], [place], Perennial Rye, RedTop, Sweet Vernal, Timothy) 0.2 ml (Volume)  1:10 Concentration -- Plantain English 0.2 ml (Volume)  1:20 Concentration -- [place] Thistle 0.2 ml (Volume)  1:40 Concentration -- Baccharis 0.5 ml (Volume)  1:20 Concentration -- Weed Mix* 0.5 ml (Volume)  1:20 Concentration -- Eastern 10 Tree Mix (also Sweet Gum) 0.2 ml (Volume)  1:20 Concentration -- Box Elder 0.2 ml (Volume)  1:10 Concentration -- Cedar, red 0.2 ml (Volume)  1:10 Concentration -- Pecan Pollen 0.2 ml (Volume)  1:10 Concentration -- Pine Mix 0.2 ml (Volume)  1:20 Concentration -- Walnut, Black Pollen 0.5 ml (Volume)  1:10 Concentration -- Dog Epithelia   3.4  ml Extract Subtotal 1.6  ml Diluent 5.0  ml Maintenance Total  Schedule:   Blue Vial (1:100,000): Schedule B (6 doses) Yellow Vial (1:10,000): Schedule B (6 doses) Green Vial (1:1,000): Schedule B (6 doses) Red Vial (1:100): Schedule A (14 doses)  Special Instructions: After completion of the first Red Vial, please space to every two weeks. After completion of the second Red Vial, please space to every 4 weeks. Ok to up dose new vials at 0.75mL --> 0.3 mL --> 0.5 mL. Ok to come twice weekly, if desired, as long as there is 48 hours between injections.

## 2024-04-30 NOTE — Progress Notes (Signed)
 Aeroallergen Immunotherapy   Ordering Provider: Dr. Hargis Springer   Patient Details  Name: Darlene Wiley  MRN: [id number]  Date of Birth: [birth year]   Order 1 of 2   Vial Label: M   0.2 ml (Volume)  1:10 Concentration -- Penicillium mix  0.2 ml (Volume)  1:20 Concentration -- Bipolaris sorokiniana  0.2 ml (Volume)  1:20 Concentration -- Drechslera spicifera  0.2 ml (Volume)  1:10 Concentration -- Fusarium moniliforme  0.2 ml (Volume)  1:40 Concentration -- Aureobasidium pullulans    1.0  ml Extract Subtotal  4.0  ml Diluent  5.0  ml Maintenance Total   Schedule:   Blue Vial (1:100,000): Schedule B (6 doses)  Yellow Vial (1:10,000): Schedule B (6 doses)  Green Vial (1:1,000): Schedule B (6 doses)  Red Vial (1:100): Schedule A (14 doses)   Special Instructions: After completion of the first Red Vial, please space to every two weeks. After completion of the second Red Vial, please space to every 4 weeks. Ok to up dose new vials at 0.49mL --> 0.3 mL --> 0.5 mL. Ok to come twice weekly, if desired, as long as there is 48 hours between injections.

## 2024-04-30 NOTE — Progress Notes (Signed)
 VIALS MADE 04-30-24

## 2024-06-03 ENCOUNTER — Ambulatory Visit

## 2024-06-03 MED ORDER — EPINEPHRINE 0.3 MG/0.3ML IJ SOAJ: 0.3000 mg | INTRAMUSCULAR | 1 refills | Status: AC | PRN

## 2024-06-16 ENCOUNTER — Ambulatory Visit

## 2024-06-16 DIAGNOSIS — J309 Allergic rhinitis, unspecified: Secondary | ICD-10-CM | POA: Diagnosis not present

## 2024-06-16 NOTE — Progress Notes (Signed)
 Immunotherapy   Patient Details  Name: Darlene Wiley MRN: [id number] Date of Birth: [birth year]  06/16/2024  [name] started injections for Blue 1:100,000 (MOLD and G-W-T-D) Following schedule: B  Frequency:1 time per week Epi-Pen:Epi-Pen Available  Consent signed and patient instructions given. No problem after 30 minute wait.   [name] [name] 06/16/2024, 2:54 PM

## 2024-06-24 ENCOUNTER — Ambulatory Visit (INDEPENDENT_AMBULATORY_CARE_PROVIDER_SITE_OTHER)

## 2024-06-24 DIAGNOSIS — J309 Allergic rhinitis, unspecified: Secondary | ICD-10-CM

## 2024-06-29 ENCOUNTER — Ambulatory Visit

## 2024-06-29 DIAGNOSIS — J309 Allergic rhinitis, unspecified: Secondary | ICD-10-CM | POA: Diagnosis not present

## 2024-07-07 ENCOUNTER — Ambulatory Visit

## 2024-07-07 DIAGNOSIS — J309 Allergic rhinitis, unspecified: Secondary | ICD-10-CM | POA: Diagnosis not present

## 2024-07-13 ENCOUNTER — Ambulatory Visit

## 2024-07-13 DIAGNOSIS — J309 Allergic rhinitis, unspecified: Secondary | ICD-10-CM | POA: Diagnosis not present

## 2024-07-20 ENCOUNTER — Ambulatory Visit (INDEPENDENT_AMBULATORY_CARE_PROVIDER_SITE_OTHER)

## 2024-07-20 DIAGNOSIS — J309 Allergic rhinitis, unspecified: Secondary | ICD-10-CM | POA: Diagnosis not present

## 2024-07-28 ENCOUNTER — Ambulatory Visit (INDEPENDENT_AMBULATORY_CARE_PROVIDER_SITE_OTHER)

## 2024-07-28 DIAGNOSIS — J309 Allergic rhinitis, unspecified: Secondary | ICD-10-CM | POA: Diagnosis not present

## 2024-08-03 ENCOUNTER — Ambulatory Visit (INDEPENDENT_AMBULATORY_CARE_PROVIDER_SITE_OTHER)

## 2024-08-03 DIAGNOSIS — J302 Other seasonal allergic rhinitis: Secondary | ICD-10-CM | POA: Diagnosis not present

## 2024-08-10 ENCOUNTER — Ambulatory Visit

## 2024-08-10 DIAGNOSIS — J302 Other seasonal allergic rhinitis: Secondary | ICD-10-CM

## 2024-08-17 ENCOUNTER — Ambulatory Visit

## 2024-08-17 DIAGNOSIS — J302 Other seasonal allergic rhinitis: Secondary | ICD-10-CM | POA: Diagnosis not present

## 2024-08-27 ENCOUNTER — Ambulatory Visit (INDEPENDENT_AMBULATORY_CARE_PROVIDER_SITE_OTHER)

## 2024-08-27 DIAGNOSIS — J302 Other seasonal allergic rhinitis: Secondary | ICD-10-CM | POA: Diagnosis not present

## 2024-09-02 ENCOUNTER — Ambulatory Visit

## 2024-09-02 DIAGNOSIS — J302 Other seasonal allergic rhinitis: Secondary | ICD-10-CM
